# Patient Record
Sex: Male | Born: 1965 | Race: White | Hispanic: No | Marital: Married | Smoking: Former smoker
Health system: Southern US, Community
[De-identification: ages and names within clinical notes are randomized; demographics above are authoritative.]

## PROBLEM LIST (undated history)

## (undated) DIAGNOSIS — K219 Gastro-esophageal reflux disease without esophagitis: Secondary | ICD-10-CM

## (undated) HISTORY — PX: FRACTURE SURGERY: SHX138

---

## 2014-06-10 ENCOUNTER — Emergency Department (HOSPITAL_COMMUNITY)
Admission: EM | Admit: 2014-06-10 | Discharge: 2014-06-11 | Disposition: A | Discharge status: Home / Self Care | Attending: Emergency Medicine | Admitting: Emergency Medicine

## 2014-06-10 ENCOUNTER — Other Ambulatory Visit: Payer: Self-pay

## 2014-06-10 ENCOUNTER — Emergency Department (HOSPITAL_COMMUNITY)

## 2014-06-10 ENCOUNTER — Encounter (HOSPITAL_COMMUNITY): Payer: Self-pay | Admitting: *Deleted

## 2014-06-10 DIAGNOSIS — Z8719 Personal history of other diseases of the digestive system: Secondary | ICD-10-CM | POA: Diagnosis not present

## 2014-06-10 DIAGNOSIS — R109 Unspecified abdominal pain: Secondary | ICD-10-CM

## 2014-06-10 DIAGNOSIS — Z87891 Personal history of nicotine dependence: Secondary | ICD-10-CM | POA: Diagnosis not present

## 2014-06-10 DIAGNOSIS — R1013 Epigastric pain: Secondary | ICD-10-CM | POA: Insufficient documentation

## 2014-06-10 DIAGNOSIS — R079 Chest pain, unspecified: Secondary | ICD-10-CM | POA: Diagnosis present

## 2014-06-10 DIAGNOSIS — Z880 Allergy status to penicillin: Secondary | ICD-10-CM | POA: Insufficient documentation

## 2014-06-10 DIAGNOSIS — R52 Pain, unspecified: Secondary | ICD-10-CM

## 2014-06-10 HISTORY — DX: Gastro-esophageal reflux disease without esophagitis: K21.9

## 2014-06-10 LAB — CBC WITH DIFFERENTIAL/PLATELET
BASOS PCT: 1 % (ref 0–1)
Basophils Absolute: 0.1 10*3/uL (ref 0.0–0.1)
Eosinophils Absolute: 0.2 10*3/uL (ref 0.0–0.7)
Eosinophils Relative: 3 % (ref 0–5)
HCT: 38.8 % — ABNORMAL LOW (ref 39.0–52.0)
HEMOGLOBIN: 14.2 g/dL (ref 13.0–17.0)
Lymphocytes Relative: 44 % (ref 12–46)
Lymphs Abs: 3.4 10*3/uL (ref 0.7–4.0)
MCH: 32.7 pg (ref 26.0–34.0)
MCHC: 36.6 g/dL — ABNORMAL HIGH (ref 30.0–36.0)
MCV: 89.4 fL (ref 78.0–100.0)
MONO ABS: 0.5 10*3/uL (ref 0.1–1.0)
MONOS PCT: 7 % (ref 3–12)
Neutro Abs: 3.5 10*3/uL (ref 1.7–7.7)
Neutrophils Relative %: 45 % (ref 43–77)
Platelets: 182 10*3/uL (ref 150–400)
RBC: 4.34 MIL/uL (ref 4.22–5.81)
RDW: 11.9 % (ref 11.5–15.5)
WBC: 7.7 10*3/uL (ref 4.0–10.5)

## 2014-06-10 LAB — COMPREHENSIVE METABOLIC PANEL
ALT: 92 U/L — ABNORMAL HIGH (ref 0–53)
ANION GAP: 11 (ref 5–15)
AST: 56 U/L — AB (ref 0–37)
Albumin: 3.8 g/dL (ref 3.5–5.2)
Alkaline Phosphatase: 104 U/L (ref 39–117)
BUN: 8 mg/dL (ref 6–23)
CALCIUM: 9.5 mg/dL (ref 8.4–10.5)
CO2: 27 meq/L (ref 19–32)
CREATININE: 0.91 mg/dL (ref 0.50–1.35)
Chloride: 102 mEq/L (ref 96–112)
GFR calc Af Amer: >90 mL/min (ref >90)
Glucose, Bld: 103 mg/dL — ABNORMAL HIGH (ref 70–99)
Potassium: 3.7 mEq/L (ref 3.7–5.3)
Sodium: 140 mEq/L (ref 137–147)
Total Bilirubin: 0.3 mg/dL (ref 0.3–1.2)
Total Protein: 6.9 g/dL (ref 6.0–8.3)

## 2014-06-10 LAB — ETHANOL

## 2014-06-10 LAB — LIPASE, BLOOD: LIPASE: 59 U/L (ref 11–59)

## 2014-06-10 MED ORDER — GI COCKTAIL ~~LOC~~
30.0000 mL | Freq: Once | ORAL | Status: AC
  Administered 2014-06-10: 30 mL via ORAL
  Filled 2014-06-10: qty 30

## 2014-06-10 MED ORDER — TRAMADOL HCL 50 MG PO TABS: 50.0000 mg | ORAL_TABLET | Freq: Four times a day (QID) | ORAL | Status: AC | PRN

## 2014-06-10 MED ORDER — HYDROMORPHONE HCL 1 MG/ML IJ SOLN
1.0000 mg | Freq: Once | INTRAMUSCULAR | Status: AC
  Administered 2014-06-10: 1 mg via INTRAVENOUS
  Filled 2014-06-10: qty 1

## 2014-06-10 MED ORDER — TRAMADOL HCL 50 MG PO TABS: 50.0000 mg | ORAL_TABLET | Freq: Four times a day (QID) | ORAL | Status: DC | PRN

## 2014-06-10 MED ORDER — FAMOTIDINE IN NACL 20-0.9 MG/50ML-% IV SOLN
20.0000 mg | Freq: Once | INTRAVENOUS | Status: AC
  Administered 2014-06-10: 20 mg via INTRAVENOUS
  Filled 2014-06-10: qty 50

## 2014-06-10 MED ORDER — OMEPRAZOLE 20 MG PO CPDR: 20.0000 mg | DELAYED_RELEASE_CAPSULE | Freq: Every day | ORAL | Status: AC

## 2014-06-10 MED ORDER — PANTOPRAZOLE SODIUM 20 MG PO TBEC: 20.0000 mg | DELAYED_RELEASE_TABLET | Freq: Every day | ORAL | Status: AC

## 2014-06-10 NOTE — ED Notes (Signed)
MD at bedside. 

## 2014-06-10 NOTE — Discharge Instructions (Signed)
Follow up next week for recheck.  Take medicine as prescribed

## 2014-06-10 NOTE — ED Provider Notes (Signed)
CSN: [id number]     Arrival date & time 06/10/14  2021 History  This chart was scribed for Benny LennertJoseph L Chelsie Burel, MD by Ronney LionSuzanne Le, ED Scribe. This patient was seen in room APA06/APA06 and the patient's care was started at 9:11 PM.    Chief Complaint  Patient presents with  . Chest Pain   Patient is a 48 y.o. male presenting with chest pain. The history is provided by the patient. No language interpreter was used.  Chest Pain Pain location:  Epigastric Pain radiates to:  Upper back, lower back and mid back Pain radiates to the back: yes   Pain severity:  Moderate Duration:  1 day Timing:  Constant Relieved by:  None tried Worsened by:  Nothing tried Ineffective treatments:  None tried Associated symptoms: abdominal pain   Associated symptoms: no back pain, no cough, no fatigue, no headache and not vomiting      HPI Comments: Christian Gray is a 48 y.o. male who presents to the Emergency Department complaining of pain underneath his ribs that feels "like a knot" and radiates to his back that began earlier today. Patient reports a history of GERD. He denies vomiting.   Past Medical History  Diagnosis Date  . GERD (gastroesophageal reflux disease)    Past Surgical History  Procedure Laterality Date  . Fracture surgery      left leg    History reviewed. No pertinent family history. History  Substance Use Topics  . Smoking status: Former Games developermoker  . Smokeless tobacco: Not on file  . Alcohol Use: No    Review of Systems  Constitutional: Negative for appetite change and fatigue.  HENT: Negative for congestion, ear discharge and sinus pressure.   Eyes: Negative for discharge.  Respiratory: Negative for cough.   Cardiovascular: Positive for chest pain.  Gastrointestinal: Positive for abdominal pain. Negative for vomiting and diarrhea.  Genitourinary: Negative for frequency and hematuria.  Musculoskeletal: Negative for back pain.  Skin: Negative for rash.  Neurological: Negative  for seizures and headaches.  Psychiatric/Behavioral: Negative for hallucinations.      Allergies  Penicillins  Home Medications   Prior to Admission medications   Not on File   BP 130/71 mmHg  Pulse 60  Temp(Src) 97.8 F (36.6 C) (Oral)  Resp 20  Ht 5\' 11"  (1.803 m)  Wt 161 lb (73.029 kg)  BMI 22.46 kg/m2  SpO2 100% Physical Exam  Constitutional: He is oriented to person, place, and time. He appears well-developed.  HENT:  Head: Normocephalic.  Eyes: Conjunctivae and EOM are normal. No scleral icterus.  Neck: Neck supple. No thyromegaly present.  Cardiovascular: Normal rate and regular rhythm.  Exam reveals no gallop and no friction rub.   No murmur heard. Pulmonary/Chest: No stridor. He has no wheezes. He has no rales. He exhibits no tenderness.  Abdominal: He exhibits no distension. There is tenderness. There is no rebound.  Moderate epigastric tenderness.  Musculoskeletal: Normal range of motion. He exhibits no edema.  Lymphadenopathy:    He has no cervical adenopathy.  Neurological: He is oriented to person, place, and time. He exhibits normal muscle tone. Coordination normal.  Skin: No rash noted. No erythema.  Psychiatric: He has a normal mood and affect. His behavior is normal.  Nursing note and vitals reviewed.   ED Course  Procedures (including critical care time)  DIAGNOSTIC STUDIES: Oxygen Saturation is 100% on room air, normal by my interpretation.    COORDINATION OF CARE: 9:17 PM - Discussed  treatment plan with pt at bedside and pt agreed to plan.    Labs Review Labs Reviewed - No data to display  Imaging Review No results found.   EKG Interpretation None      MDM   Final diagnoses:  None    Epigastric pain,    tx for pud  The chart was scribed for me under my direct supervision.  I personally performed the history, physical, and medical decision making and all procedures in the evaluation of this patient.Benny Lennert.   Nesha Counihan L Jadon Ressler,  MD 06/10/14 [phone]

## 2014-06-10 NOTE — ED Notes (Signed)
Pt states he has a history of GERD. Pt c/o epigastric pain, abdominal pain that radiates to his back, and he also states he has a knot and is point to the right upper quadrant.

## 2014-06-11 ENCOUNTER — Encounter (HOSPITAL_COMMUNITY): Payer: Self-pay | Admitting: *Deleted

## 2014-06-11 ENCOUNTER — Emergency Department (HOSPITAL_COMMUNITY)
Admission: EM | Admit: 2014-06-11 | Discharge: 2014-06-11 | Attending: Emergency Medicine | Admitting: Emergency Medicine

## 2014-06-11 ENCOUNTER — Emergency Department (HOSPITAL_COMMUNITY)

## 2014-06-11 DIAGNOSIS — K219 Gastro-esophageal reflux disease without esophagitis: Secondary | ICD-10-CM | POA: Diagnosis not present

## 2014-06-11 DIAGNOSIS — Z79899 Other long term (current) drug therapy: Secondary | ICD-10-CM | POA: Insufficient documentation

## 2014-06-11 DIAGNOSIS — Z87891 Personal history of nicotine dependence: Secondary | ICD-10-CM | POA: Insufficient documentation

## 2014-06-11 DIAGNOSIS — R1013 Epigastric pain: Secondary | ICD-10-CM | POA: Insufficient documentation

## 2014-06-11 DIAGNOSIS — Z880 Allergy status to penicillin: Secondary | ICD-10-CM | POA: Insufficient documentation

## 2014-06-11 NOTE — ED Provider Notes (Signed)
48 year old male who was signed out to me by Dr. Lynelle DoctorKnapp pending an abdominal ultrasound. He has a history of GE reflux. Today he was seen and evaluated and had some slightly elevated liver enzymes and epigastric discomfort. Ultrasound obtained does not show any evidence of acute cholecystitis. He'll be [facility] with Protonix and advised follow-up.  [place] Abdomen Complete  06/11/2014   CLINICAL DATA:  Epigastric abdominal pain radiating to the right upper quadrant  EXAM: ULTRASOUND ABDOMEN COMPLETE  COMPARISON:  None.  FINDINGS: Gallbladder: Probable cholesterol polyps with comet tail shadowing artifact are noted within the gallbladder, largest 5 mm. No gallbladder wall thickening, pericholecystic fluid, or sonographic Murphy sign.  Common bile duct: Diameter: 5 mm  Liver: No focal lesion identified. Within normal limits in parenchymal echogenicity.  IVC: No abnormality visualized.  Pancreas: Visualized portion unremarkable.  Spleen: Size and appearance within normal limits.  Right Kidney: Length: 11 cm. Echogenicity within normal limits. No mass or hydronephrosis visualized.  Left Kidney: Length: 11.7 cm. Echogenicity within normal limits. No mass or hydronephrosis visualized.  Abdominal aorta: No aneurysm visualized.  Other findings: None.  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 This is typically asymptomatic and no specific further followup is required.  No acute intra-abdominal or pelvic pathology.   Electronically Signed   By: Christiana PellantGretchen  Green M.D.   On: 06/11/2014 09:32   Dg Abd Acute W/chest  06/10/2014   CLINICAL DATA:  Acute onset of epigastric abdominal pain, radiating to the back. Initial encounter.  EXAM: ACUTE ABDOMEN SERIES (ABDOMEN 2 VIEW & CHEST 1 VIEW)  COMPARISON:  None.  FINDINGS: The lungs are well-aerated and clear. There is no evidence of focal opacification, pleural effusion or pneumothorax. The  cardiomediastinal silhouette is within normal limits.  The visualized bowel gas pattern is unremarkable. Scattered stool and air are seen within the colon; there is no evidence of small bowel dilatation to suggest obstruction. No free intra-abdominal air is identified on the provided upright view.  No acute osseous abnormalities are seen; the sacroiliac joints are unremarkable in appearance. Mild chronic right-sided rib deformities are seen.  IMPRESSION: 1. Unremarkable bowel gas pattern; no free intra-abdominal air seen. 2. No acute cardiopulmonary process identified.   Electronically Signed   By: Roanna RaiderJeffery  Chang M.D.   On: 06/10/2014 22:15   Results for orders placed or performed during the hospital encounter of 06/10/14  CBC with Differential  Result Value Ref Range   WBC 7.7 4.0 - 10.5 K/uL   RBC 4.34 4.22 - 5.81 MIL/uL   Hemoglobin 14.2 13.0 - 17.0 g/dL   HCT 29.538.8 (L) 62.139.0 - 30.852.0 %   MCV 89.4 78.0 - 100.0 fL   MCH 32.7 26.0 - 34.0 pg   MCHC 36.6 (H) 30.0 - 36.0 g/dL   RDW 65.711.9 84.611.5 - 96.215.5 %   Platelets 182 150 - 400 K/uL   Neutrophils Relative % 45 43 - 77 %   Neutro Abs 3.5 1.7 - 7.7 K/uL   Lymphocytes Relative 44 12 - 46 %   Lymphs Abs 3.4 0.7 - 4.0 K/uL   Monocytes Relative 7 3 - 12 %   Monocytes Absolute 0.5 0.1 - 1.0 K/uL   Eosinophils Relative 3 0 - 5 %   Eosinophils Absolute 0.2 0.0 - 0.7 K/uL   Basophils Relative 1 0 - 1 %   Basophils Absolute 0.1 0.0 - 0.1 K/uL  Comprehensive metabolic panel  Result Value Ref Range  Sodium 140 137 - 147 mEq/L   Potassium 3.7 3.7 - 5.3 mEq/L   Chloride 102 96 - 112 mEq/L   CO2 27 19 - 32 mEq/L   Glucose, Bld 103 (H) 70 - 99 mg/dL   BUN 8 6 - 23 mg/dL   Creatinine, Ser 8.290.91 0.50 - 1.35 mg/dL   Calcium 9.5 8.4 - 56.210.5 mg/dL   Total Protein 6.9 6.0 - 8.3 g/dL   Albumin 3.8 3.5 - 5.2 g/dL   AST 56 (H) 0 - 37 U/L   ALT 92 (H) 0 - 53 U/L   Alkaline Phosphatase 104 39 - 117 U/L   Total Bilirubin 0.3 0.3 - 1.2 mg/dL   GFR calc non Af Amer  >90 >90 mL/min   GFR calc Af Amer >90 >90 mL/min   Anion gap 11 5 - 15  Ethanol  Result Value Ref Range   Alcohol, Ethyl (B) <11 0 - 11 mg/dL  Lipase, blood  Result Value Ref Range   Lipase 59 11 - 59 U/L     Hilario Quarryanielle S Datra Clary, MD 06/11/14 1106

## 2014-06-11 NOTE — Discharge Instructions (Signed)
°  Liver enzymes are slightly elevated and will need a recheck next week.  US shows no evidence of acute gallbladder problems but does show some sludge.  Please avoid a fatty diet.  Recheck with your doctor.  Abdominal Pain Many things can cause abdominal pain. Usually, abdominal pain is not caused by a disease and will improve without treatment. It can often be observed and treated at home. Your health care provider will do a physical exam and possibly order blood tests and X-rays to help determine the seriousness of your pain. However, in many cases, more time must pass before a clear cause of the pain can be found. Before that point, your health care provider may not know if you need more testing or further treatment. HOME CARE INSTRUCTIONS  Monitor your abdominal pain for any changes. The following actions may help to alleviate any discomfort you are experiencing:  Only take over-the-counter or prescription medicines as directed by your health care provider.  Do not take laxatives unless directed to do so by your health care provider.  Try a clear liquid diet (broth, tea, or water) as directed by your health care provider. Slowly move to a bland diet as tolerated. SEEK MEDICAL CARE IF:  You have unexplained abdominal pain.  You have abdominal pain associated with nausea or diarrhea.  You have pain when you urinate or have a bowel movement.  You experience abdominal pain that wakes you in the night.  You have abdominal pain that is worsened or improved by eating food.  You have abdominal pain that is worsened with eating fatty foods.  You have a fever. SEEK IMMEDIATE MEDICAL CARE IF:   Your pain does not go away within 2 hours.  You keep throwing up (vomiting).  Your pain is felt only in portions of the abdomen, such as the right side or the left lower portion of the abdomen.  You pass bloody or black tarry stools. MAKE SURE YOU:  Understand these instructions.   Will watch  your condition.   Will get help right away if you are not doing well or get worse.  Document Released: 04/02/2005 Document Revised: 06/28/2013 Document Reviewed: 03/02/2013 [facility] Patient Information 2015 [employer] [place]. This information is not intended to replace advice given to you by your health care provider. Make sure you discuss any questions you have with your health care provider.

## 2014-06-11 NOTE — ED Notes (Signed)
Pt c/o continued abdominal pain; pt states he was given a GI cocktail with his prior visit with no relief; pt states the nurse at the facility needs to be re-evaluated and check his gallbladder and pancreas

## 2014-06-11 NOTE — ED Notes (Signed)
Per Rene Kocheregina in Radiology US technician will be available at 0900 this am.

## 2014-06-11 NOTE — ED Notes (Signed)
Called xray to verify time of ultrasound, tech to call back.

## 2014-06-11 NOTE — ED Provider Notes (Signed)
CSN: [id number]     Arrival date & time 06/11/14  0458 History   None    Chief Complaint  Patient presents with  . Abdominal Pain     (Consider location/radiation/quality/duration/timing/severity/associated sxs/prior Treatment) HPI  Patient states yesterday morning he started getting epigastric abdominal pain that he describes as squeezing and sharp that can radiate into his right upper quadrant and into his right back. He states the pain comes and goes and lasts a few minutes. Nothing he does makes the pain hurt more. He states sometimes pressing on his abdomen makes the pain feel better. He has nausea without vomiting or diarrhea. He denies fever. He denies access to alcohol. He states he's never had this pain before. Patient states he last ate yesterday morning when he ate a bear claw and drink milk. Patient was seen in the ED last night and was discharged around midnight. He reports he continues to have pain.  PCP OOT  Past Medical History  Diagnosis Date  . GERD (gastroesophageal reflux disease)    Past Surgical History  Procedure Laterality Date  . Fracture surgery      left leg    History reviewed. No pertinent family history. History  Substance Use Topics  . Smoking status: Former Games developer  . Smokeless tobacco: Not on file  . 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     Allergies  Penicillins  Home Medications   Prior to Admission medications   Medication Sig Start Date End Date Taking? Authorizing Provider  omeprazole (PRILOSEC) 20 MG capsule Take 1 capsule (20 mg total) by mouth daily. 06/10/14   Benny Lennert, MD  pantoprazole (PROTONIX) 20 MG tablet Take 1 tablet (20 mg total) by mouth daily. 06/10/14   Benny Lennert, MD  traMADol (ULTRAM) 50 MG tablet Take 1 tablet (50 mg total) by  mouth every 6 (six) hours as needed. 06/10/14   Benny Lennert, MD  traMADol (ULTRAM) 50 MG tablet Take 1 tablet (50 mg total) by mouth every 6 (six) hours as needed. 06/10/14   Benny Lennert, MD   BP 108/84 mmHg  Pulse 61  Temp(Src) 97.6 F (36.4 C) (Oral)  Resp 18  Ht 5\' 11"  (1.803 m)  Wt 161 lb (73.029 kg)  BMI 22.46 kg/m2  SpO2 96%  Vital signs normal   Physical Exam  Constitutional: He is oriented to person, place, and time. He appears well-developed and well-nourished.  Non-toxic appearance. He does not appear ill. No distress.  HENT:  Head: Normocephalic and atraumatic.  Right Ear: External ear normal.  Left Ear: External ear normal.  Nose: Nose normal. No mucosal edema or rhinorrhea.  Mouth/Throat: Oropharynx is clear and moist and mucous membranes are normal. No dental abscesses or uvula swelling.  Eyes: Conjunctivae and EOM are normal. Pupils are equal, round, and reactive to light.  Neck: Normal range of motion and full passive range of motion without pain. Neck supple.  Cardiovascular: Normal rate, regular rhythm and normal heart sounds.  Exam reveals no gallop and no friction rub.   No murmur heard. Pulmonary/Chest: Effort normal and breath sounds normal. No respiratory distress. He has no wheezes. He has no rhonchi. He has no rales. He exhibits no tenderness and no crepitus.  Abdominal: Soft. Normal appearance  and bowel sounds are normal. He exhibits no distension. There is tenderness. There is no rebound and no guarding.    Tender diffusely in the upper abdomen but mainly tender in the epigastric area  Musculoskeletal: Normal range of motion. He exhibits no edema or tenderness.  Moves all extremities well.   Neurological: He is alert and oriented to person, place, and time. He has normal strength. No cranial nerve deficit.  Skin: Skin is warm, dry and intact. No rash noted. No erythema. No pallor.  Psychiatric: He has a normal mood and affect. His speech is normal  and behavior is normal. His mood appears not anxious.  Nursing note and vitals reviewed.   ED Course  Procedures (including critical care time)  Patient kept nothing by mouth. His lab work was not repeated because what he had his less than 12 hours old. Ultrasound the abdomen was ordered to look for gallstones or pancreatitis. Pt will be left at change of shift to get his US done.     Labs Review Results for orders placed or performed during the hospital encounter of 06/10/14  CBC with Differential  Result Value Ref Range   WBC 7.7 4.0 - 10.5 K/uL   RBC 4.34 4.22 - 5.81 MIL/uL   Hemoglobin 14.2 13.0 - 17.0 g/dL   HCT 16.138.8 (L) 09.639.0 - 04.552.0 %   MCV 89.4 78.0 - 100.0 fL   MCH 32.7 26.0 - 34.0 pg   MCHC 36.6 (H) 30.0 - 36.0 g/dL   RDW 40.911.9 81.111.5 - 91.415.5 %   Platelets 182 150 - 400 K/uL   Neutrophils Relative % 45 43 - 77 %   Neutro Abs 3.5 1.7 - 7.7 K/uL   Lymphocytes Relative 44 12 - 46 %   Lymphs Abs 3.4 0.7 - 4.0 K/uL   Monocytes Relative 7 3 - 12 %   Monocytes Absolute 0.5 0.1 - 1.0 K/uL   Eosinophils Relative 3 0 - 5 %   Eosinophils Absolute 0.2 0.0 - 0.7 K/uL   Basophils Relative 1 0 - 1 %   Basophils Absolute 0.1 0.0 - 0.1 K/uL  Comprehensive metabolic panel  Result Value Ref Range   Sodium 140 137 - 147 mEq/L   Potassium 3.7 3.7 - 5.3 mEq/L   Chloride 102 96 - 112 mEq/L   CO2 27 19 - 32 mEq/L   Glucose, Bld 103 (H) 70 - 99 mg/dL   BUN 8 6 - 23 mg/dL   Creatinine, Ser 7.820.91 0.50 - 1.35 mg/dL   Calcium 9.5 8.4 - 95.610.5 mg/dL   Total Protein 6.9 6.0 - 8.3 g/dL   Albumin 3.8 3.5 - 5.2 g/dL   AST 56 (H) 0 - 37 U/L   ALT 92 (H) 0 - 53 U/L   Alkaline Phosphatase 104 39 - 117 U/L   Total Bilirubin 0.3 0.3 - 1.2 mg/dL   GFR calc non Af Amer >90 >90 mL/min   GFR calc Af Amer >90 >90 mL/min   Anion gap 11 5 - 15  Ethanol  Result Value Ref Range   Alcohol, Ethyl (B) <11 0 - 11 mg/dL  Lipase, blood  Result Value Ref Range   Lipase 59 11 - 59 U/L   Laboratory  interpretation all normal except elevation of LFTs     Imaging Review Dg Abd Acute W/chest  06/10/2014   CLINICAL DATA:  Acute onset of epigastric abdominal pain, radiating to the back. Initial encounter.  EXAM: ACUTE ABDOMEN SERIES (ABDOMEN  2 VIEW & CHEST 1 VIEW)  COMPARISON:  None.  FINDINGS: The lungs are well-aerated and clear. There is no evidence of focal opacification, pleural effusion or pneumothorax. The cardiomediastinal silhouette is within normal limits.  The visualized bowel gas pattern is unremarkable. Scattered stool and air are seen within the colon; there is no evidence of small bowel dilatation to suggest obstruction. No free intra-abdominal air is identified on the provided upright view.  No acute osseous abnormalities are seen; the sacroiliac joints are unremarkable in appearance. Mild chronic right-sided rib deformities are seen.  IMPRESSION: 1. Unremarkable bowel gas pattern; no free intra-abdominal air seen. 2. No acute cardiopulmonary process identified.   Electronically Signed   By: Roanna RaiderJeffery  Chang M.D.   On: 06/10/2014 22:15     EKG Interpretation None      MDM   Final diagnoses:  Epigastric abdominal pain    Disposition pending  Devoria AlbeIva Lashawnda Hancox, MD, Armando GangFACEP     Ward GivensIva L Earlee Herald, MD 06/11/14 [phone]

## 2014-09-23 ENCOUNTER — Encounter (HOSPITAL_COMMUNITY): Payer: Self-pay | Admitting: Cardiology

## 2014-09-23 ENCOUNTER — Emergency Department (HOSPITAL_COMMUNITY)
Admission: EM | Admit: 2014-09-23 | Discharge: 2014-09-23 | Disposition: A | Discharge status: Home / Self Care | Attending: Emergency Medicine | Admitting: Emergency Medicine

## 2014-09-23 DIAGNOSIS — Z87891 Personal history of nicotine dependence: Secondary | ICD-10-CM | POA: Insufficient documentation

## 2014-09-23 DIAGNOSIS — M5432 Sciatica, left side: Secondary | ICD-10-CM | POA: Diagnosis not present

## 2014-09-23 DIAGNOSIS — Z79899 Other long term (current) drug therapy: Secondary | ICD-10-CM | POA: Insufficient documentation

## 2014-09-23 DIAGNOSIS — Z880 Allergy status to penicillin: Secondary | ICD-10-CM | POA: Diagnosis not present

## 2014-09-23 DIAGNOSIS — K219 Gastro-esophageal reflux disease without esophagitis: Secondary | ICD-10-CM | POA: Insufficient documentation

## 2014-09-23 DIAGNOSIS — M545 Low back pain: Secondary | ICD-10-CM | POA: Diagnosis present

## 2014-09-23 MED ORDER — OXYCODONE-ACETAMINOPHEN 5-325 MG PO TABS
1.0000 | ORAL_TABLET | Freq: Once | ORAL | Status: AC
  Administered 2014-09-23: 1 via ORAL
  Filled 2014-09-23: qty 1

## 2014-09-23 MED ORDER — IBUPROFEN 800 MG PO TABS: 800.0000 mg | ORAL_TABLET | Freq: Three times a day (TID) | ORAL | Status: AC

## 2014-09-23 MED ORDER — CYCLOBENZAPRINE HCL 10 MG PO TABS: 10.0000 mg | ORAL_TABLET | Freq: Two times a day (BID) | ORAL | Status: AC | PRN

## 2014-09-23 MED ORDER — CYCLOBENZAPRINE HCL 10 MG PO TABS
10.0000 mg | ORAL_TABLET | Freq: Once | ORAL | Status: AC
  Administered 2014-09-23: 10 mg via ORAL
  Filled 2014-09-23: qty 1
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
## 2014-09-23 NOTE — ED Notes (Signed)
C/o lower back pain since Monday.  States his back got flared up using a weed wacker.  History of back fracture.

## 2014-09-23 NOTE — ED Notes (Signed)
Patient with no complaints at this time. Respirations even and unlabored. Skin warm/dry. Discharge instructions reviewed with patient at this time. Patient given opportunity to voice concerns/ask questions. Patient discharged at this time and left Emergency Department with steady gait.   

## 2016-05-21 IMAGING — CR DG ABDOMEN ACUTE W/ 1V CHEST
3 series · 3 of 3 positions shown · non-contrast
Comparison: None.

CLINICAL DATA: Acute onset of epigastric abdominal pain, radiating
to the back. Initial encounter.

EXAM:
ACUTE ABDOMEN SERIES (ABDOMEN 2 VIEW & CHEST 1 VIEW)

[view not recorded (1 of 3)]
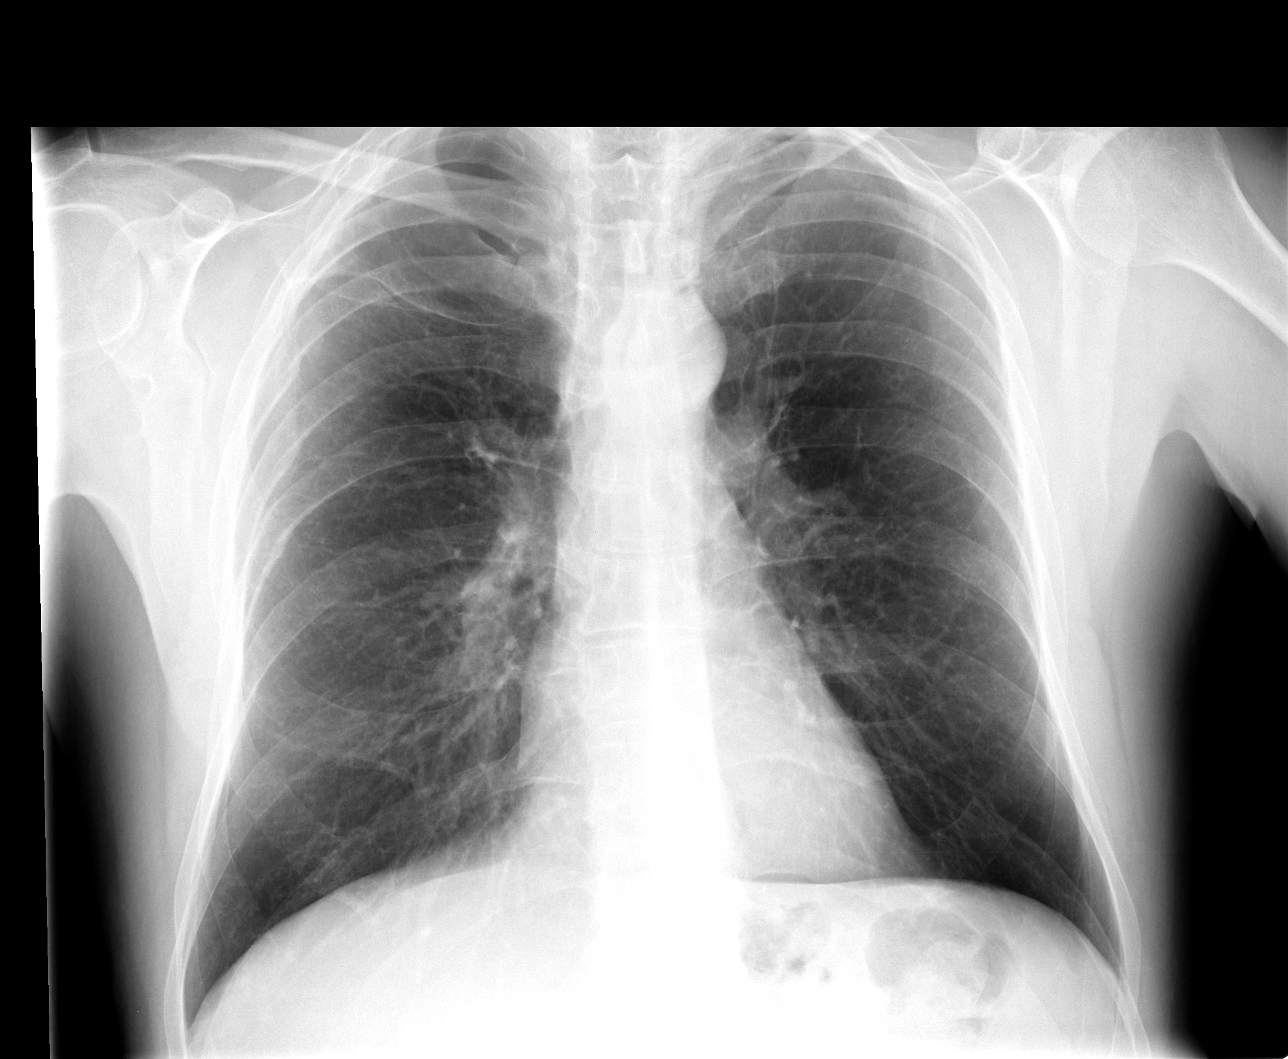

[view not recorded (2 of 3)]
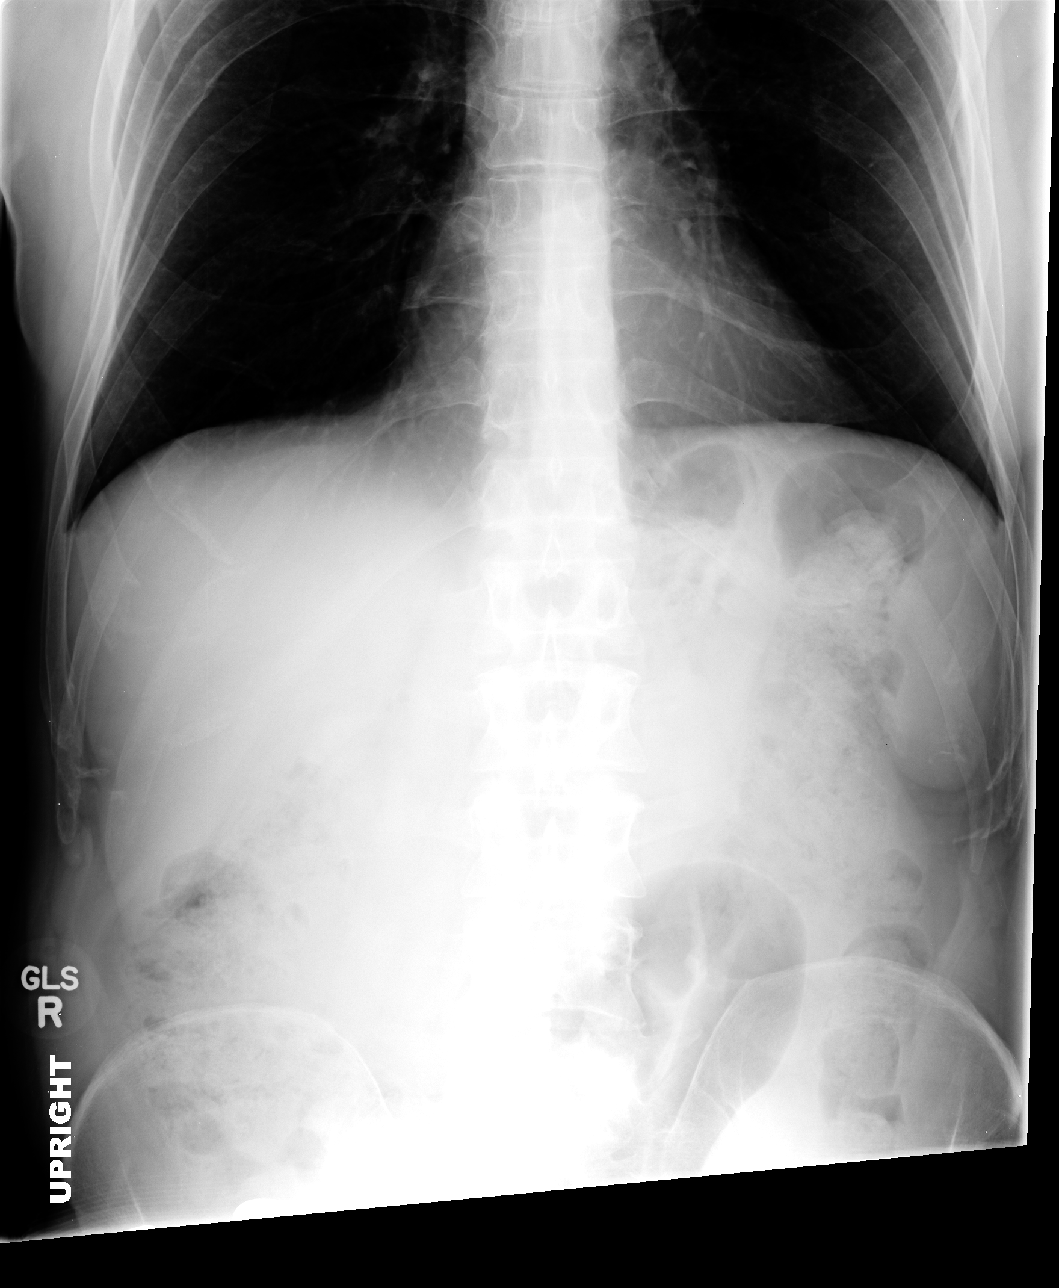

[view not recorded (3 of 3)]
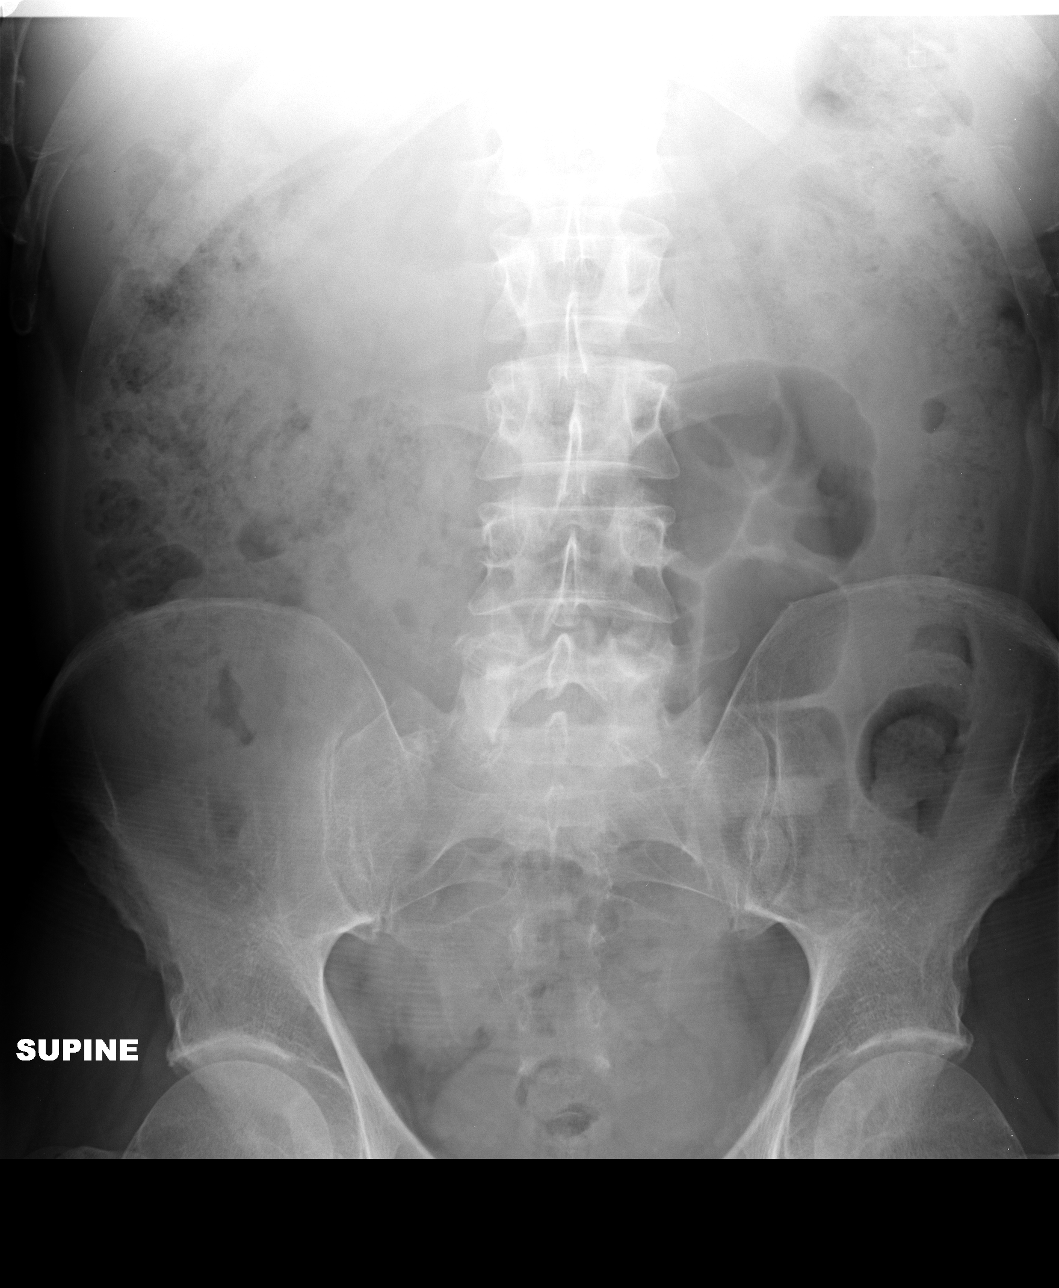

[3 of 3 positions shown; findings below may reference images not displayed]

FINDINGS: The lungs are well-aerated and clear. There is no evidence of focal
opacification, pleural effusion or pneumothorax. The
cardiomediastinal silhouette is within normal limits.

The visualized bowel gas pattern is unremarkable. Scattered stool
and air are seen within the colon; there is no evidence of small
bowel dilatation to suggest obstruction. No free intra-abdominal air
is identified on the provided upright view.

No acute osseous abnormalities are seen; the sacroiliac joints are
unremarkable in appearance. Mild chronic right-sided rib deformities
are seen.
IMPRESSION: 1. Unremarkable bowel gas pattern; no free intra-abdominal air seen.
2. No acute cardiopulmonary process identified.

## 2016-05-22 IMAGING — US US ABDOMEN COMPLETE
1 series · 14 of 25 positions shown · non-contrast
Comparison: None.

CLINICAL DATA: Epigastric abdominal pain radiating to the right
upper quadrant

EXAM:
ULTRASOUND ABDOMEN COMPLETE

[Series 1: us abdomen complete · 0.24mm/px · 14 of 92 slices shown]
[im 1/92]
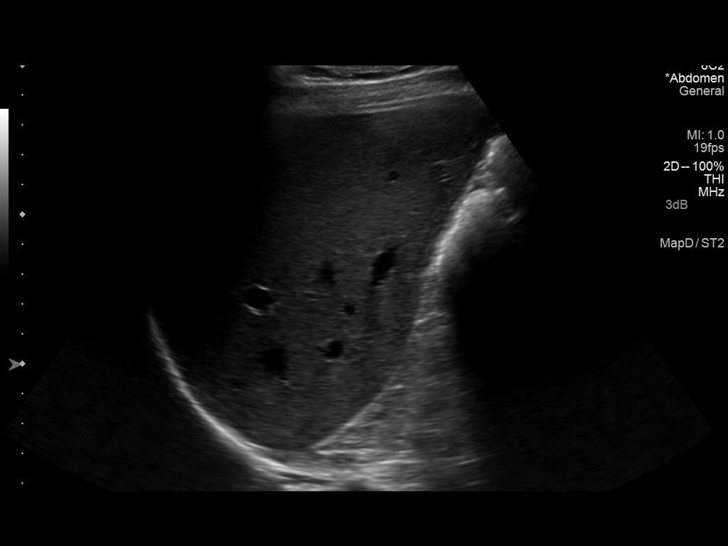
[im 8/92]
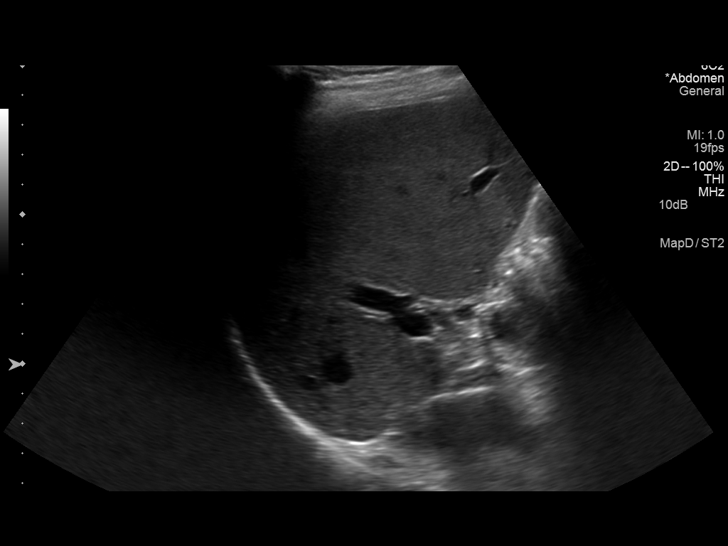
[im 16/92]
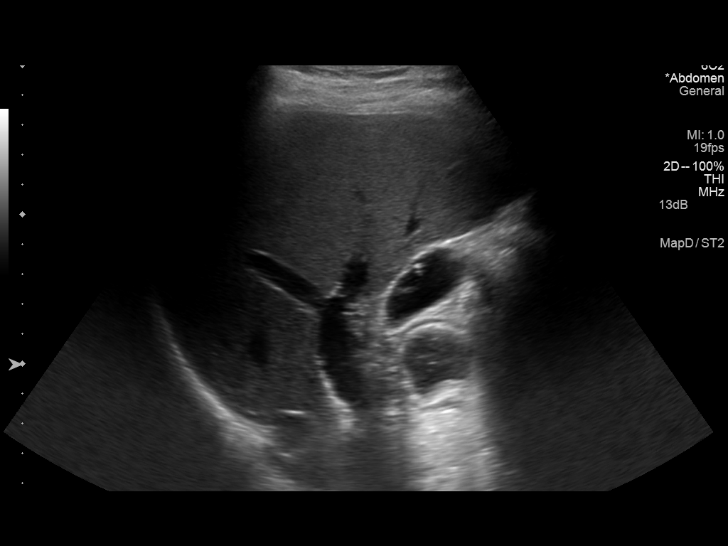
[im 23/92]
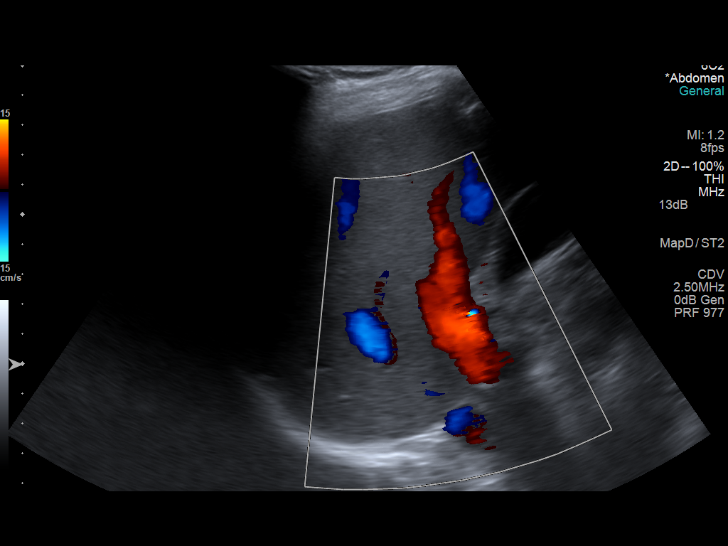
[im 31/92]
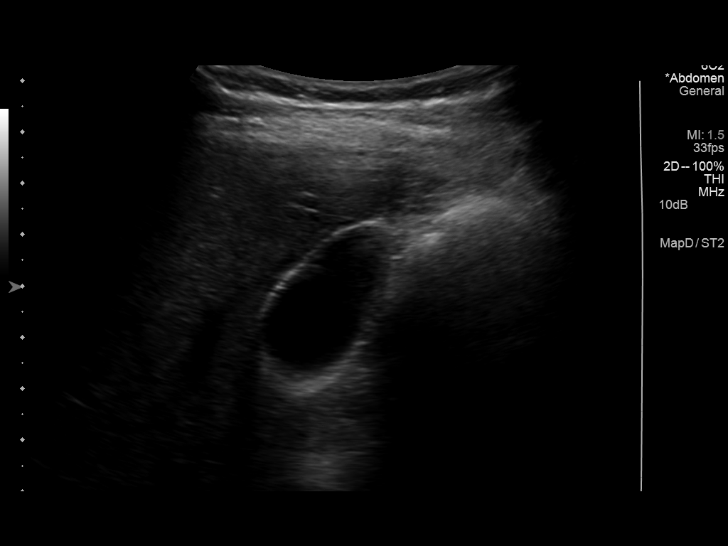
[im 35/92]
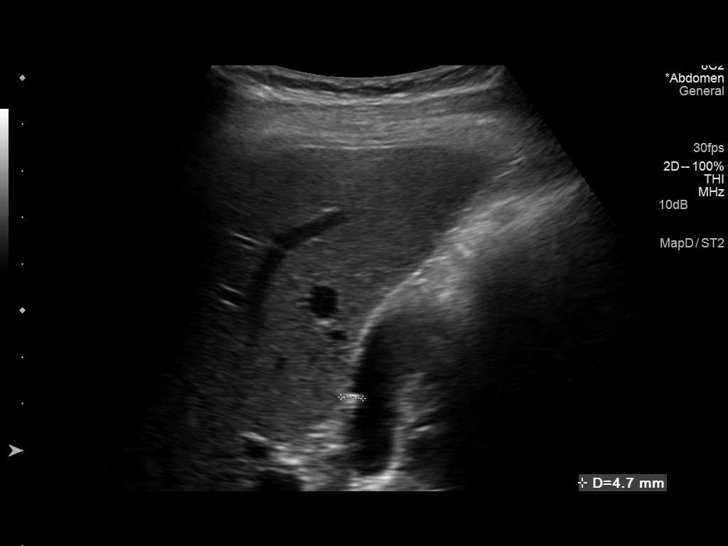
[im 42/92]
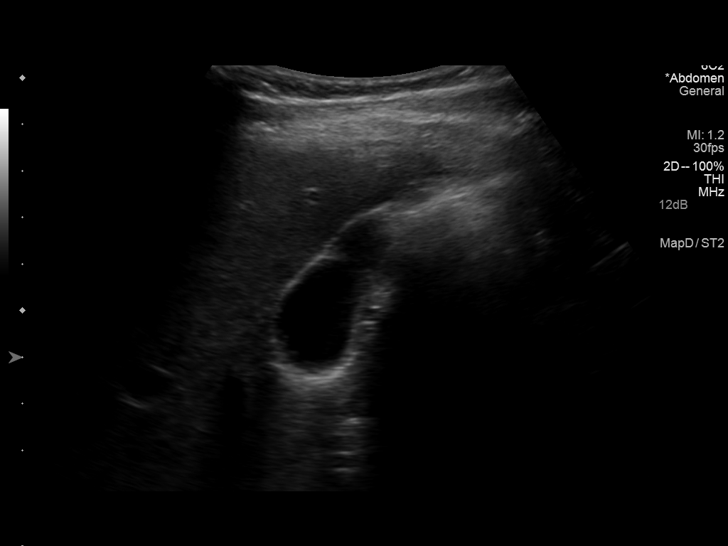
[im 50/92]
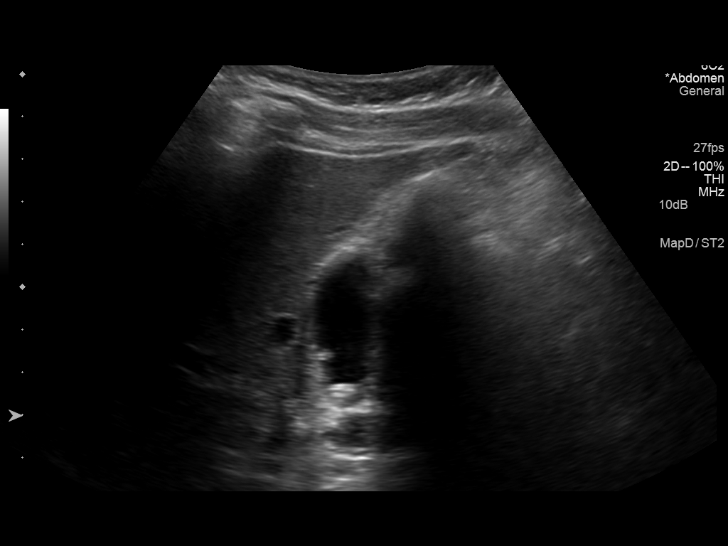
[im 57/92]
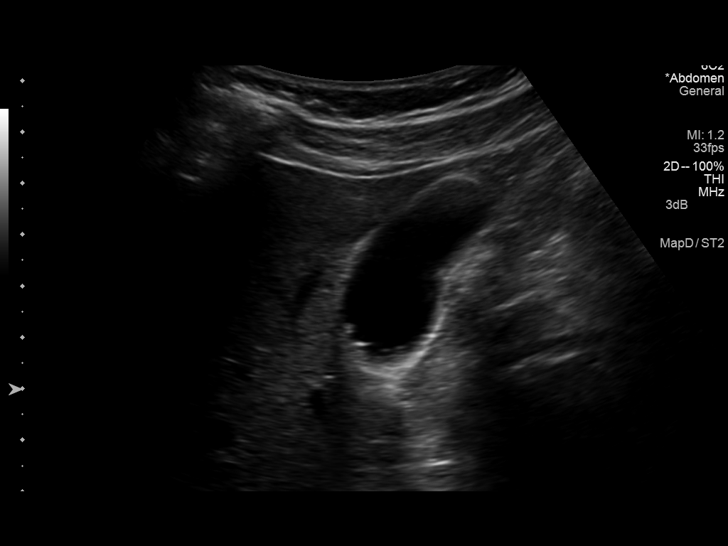
[im 61/92]
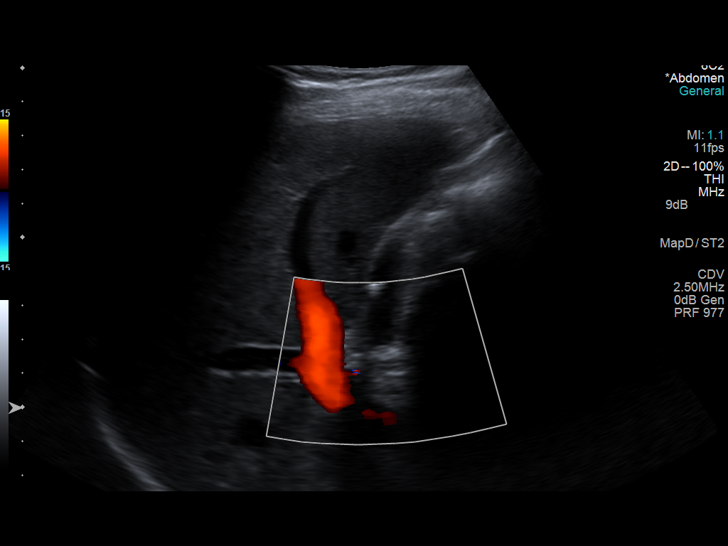
[im 69/92]
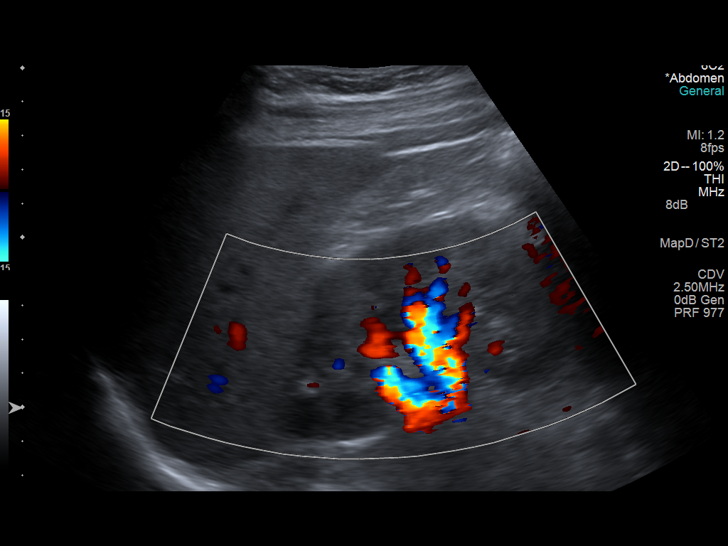
[im 76/92]
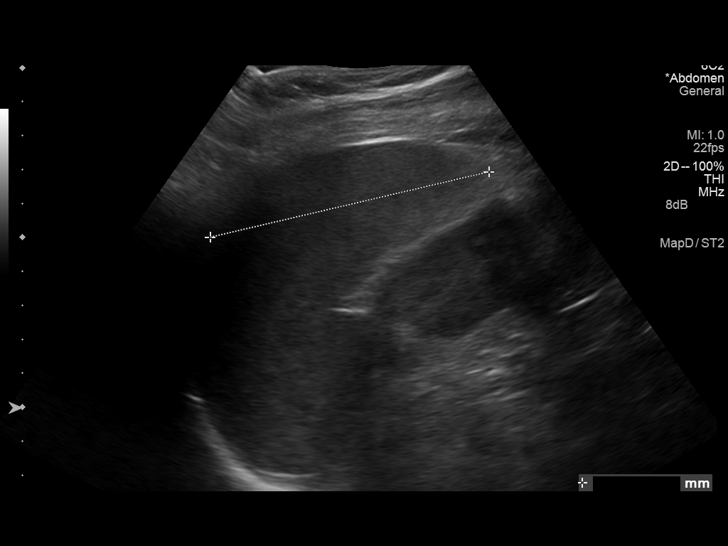
[im 84/92]
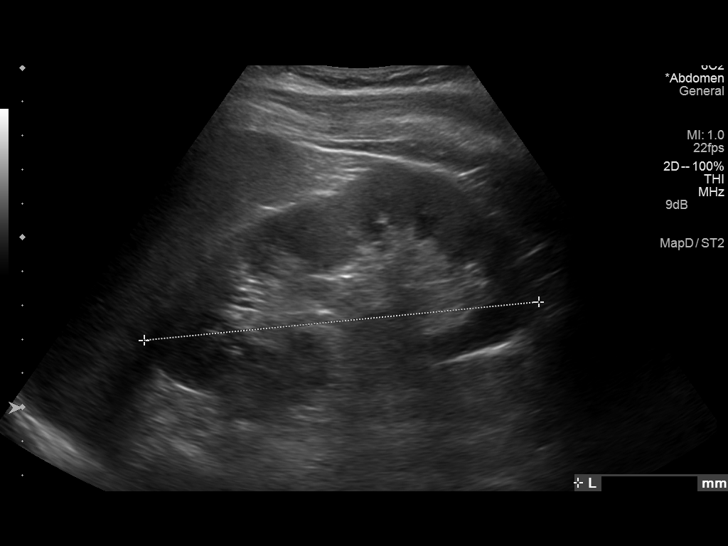
[im 92/92]
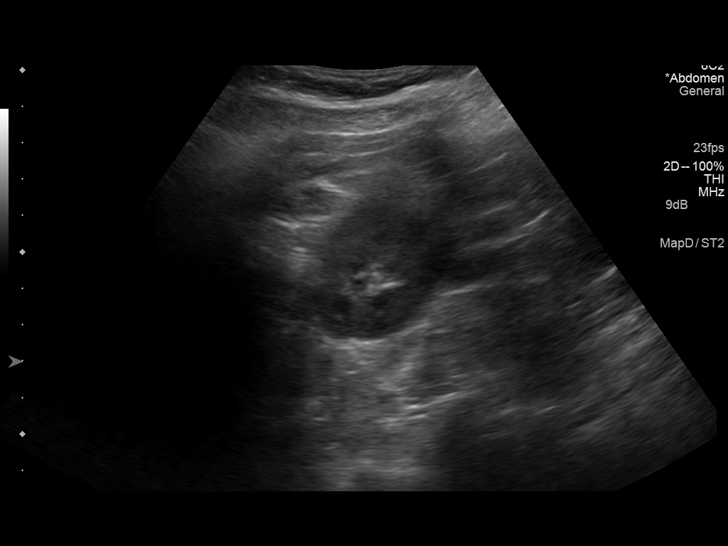

[14 of 25 positions shown; findings below may reference images not displayed]

FINDINGS: Gallbladder: Probable cholesterol polyps with comet tail shadowing
artifact are noted within the gallbladder, largest 5 mm. No
gallbladder wall thickening, pericholecystic fluid, or sonographic
Murphy sign.

Common bile duct: Diameter: 5 mm

Liver: No focal lesion identified. Within normal limits in
parenchymal echogenicity.

IVC: No abnormality visualized.

Pancreas: Visualized portion unremarkable.

Spleen: Size and appearance within normal limits.

Right Kidney: Length: 11 cm. Echogenicity within normal limits. No
mass or hydronephrosis visualized.

Left Kidney: Length: 11.7 cm. Echogenicity within normal limits. No
mass or hydronephrosis visualized.

Abdominal aorta: No aneurysm visualized.

Other findings: None.
IMPRESSION: Probable gallbladder cholesterol polyps without other sonographic
evidence for acute cholecystitis. This is typically asymptomatic and
no specific further followup is required.

No acute intra-abdominal or pelvic pathology.
